# Patient Record
Sex: Male | Born: 1955 | Race: White | Hispanic: No | Marital: Married | Smoking: Never smoker
Health system: Southern US, Community
[De-identification: ages and names within clinical notes are randomized; demographics above are authoritative.]

## PROBLEM LIST (undated history)

## (undated) DIAGNOSIS — M25552 Pain in left hip: Secondary | ICD-10-CM

## (undated) DIAGNOSIS — M1712 Unilateral primary osteoarthritis, left knee: Secondary | ICD-10-CM

## (undated) HISTORY — PX: ELBOW SURGERY: SHX618

## (undated) HISTORY — PX: KNEE SURGERY: SHX244

## (undated) HISTORY — PX: HAND SURGERY: SHX662

---
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
## 1997-07-16 ENCOUNTER — Ambulatory Visit (HOSPITAL_COMMUNITY): Admission: RE | Admit: 1997-07-16 | Discharge: 1997-07-16 | Payer: Self-pay | Admitting: Specialist

## 2003-03-07 ENCOUNTER — Ambulatory Visit (HOSPITAL_BASED_OUTPATIENT_CLINIC_OR_DEPARTMENT_OTHER): Admission: RE | Admit: 2003-03-07 | Discharge: 2003-03-07 | Payer: Self-pay | Admitting: Internal Medicine

## 2009-08-22 ENCOUNTER — Ambulatory Visit (HOSPITAL_COMMUNITY): Admission: RE | Admit: 2009-08-22 | Discharge: 2009-08-22 | Payer: Self-pay | Admitting: Orthopedic Surgery

## 2009-10-01 ENCOUNTER — Ambulatory Visit (HOSPITAL_COMMUNITY): Admission: RE | Admit: 2009-10-01 | Discharge: 2009-10-01 | Payer: Self-pay | Admitting: Orthopedic Surgery

## 2010-04-09 LAB — CBC
Hemoglobin: 13.5 g/dL (ref 13.0–17.0)
Platelets: 177 10*3/uL (ref 150–400)
WBC: 4.4 10*3/uL (ref 4.0–10.5)

## 2010-04-09 LAB — COMPREHENSIVE METABOLIC PANEL
Albumin: 4.2 g/dL (ref 3.5–5.2)
Calcium: 9 mg/dL (ref 8.4–10.5)
Chloride: 109 mEq/L (ref 96–112)
Creatinine, Ser: 0.93 mg/dL (ref 0.4–1.5)
Glucose, Bld: 113 mg/dL — ABNORMAL HIGH (ref 70–99)
Potassium: 4.2 mEq/L (ref 3.5–5.1)
Sodium: 140 mEq/L (ref 135–145)
Total Bilirubin: 0.8 mg/dL (ref 0.3–1.2)
Total Protein: 7.3 g/dL (ref 6.0–8.3)

## 2010-04-09 LAB — URINALYSIS, ROUTINE W REFLEX MICROSCOPIC: Hgb urine dipstick: NEGATIVE

## 2010-04-09 LAB — DIFFERENTIAL
Eosinophils Absolute: 0.2 10*3/uL (ref 0.0–0.7)
Monocytes Absolute: 0.3 10*3/uL (ref 0.1–1.0)
Monocytes Relative: 7 % (ref 3–12)
Neutro Abs: 2.4 10*3/uL (ref 1.7–7.7)

## 2012-05-10 IMAGING — CR DG ORBITS FOR FOREIGN BODY
2 series · 2 of 2 positions shown · non-contrast
Comparison: None.

CLINICAL DATA: Pre MRI

ORBITS FOR FOREIGN BODY - 2 VIEW

[view not recorded (1 of 2)]
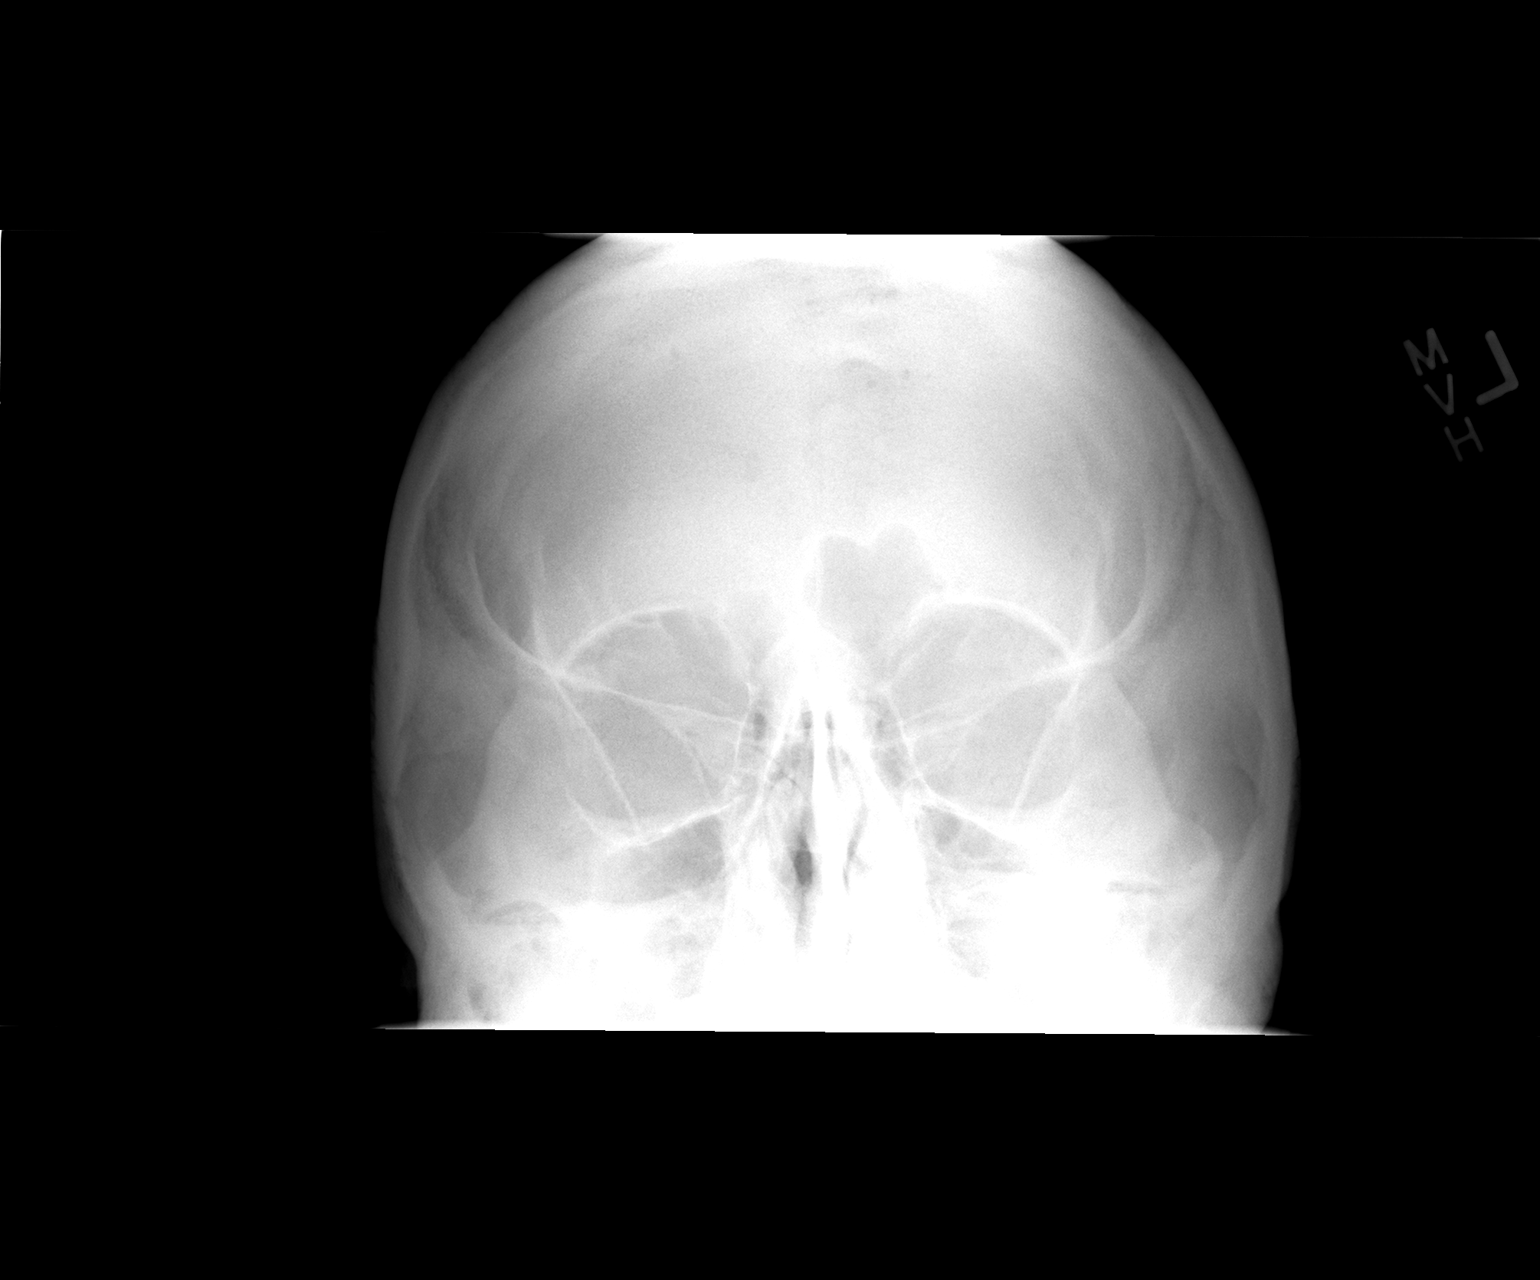

[view not recorded (2 of 2)]
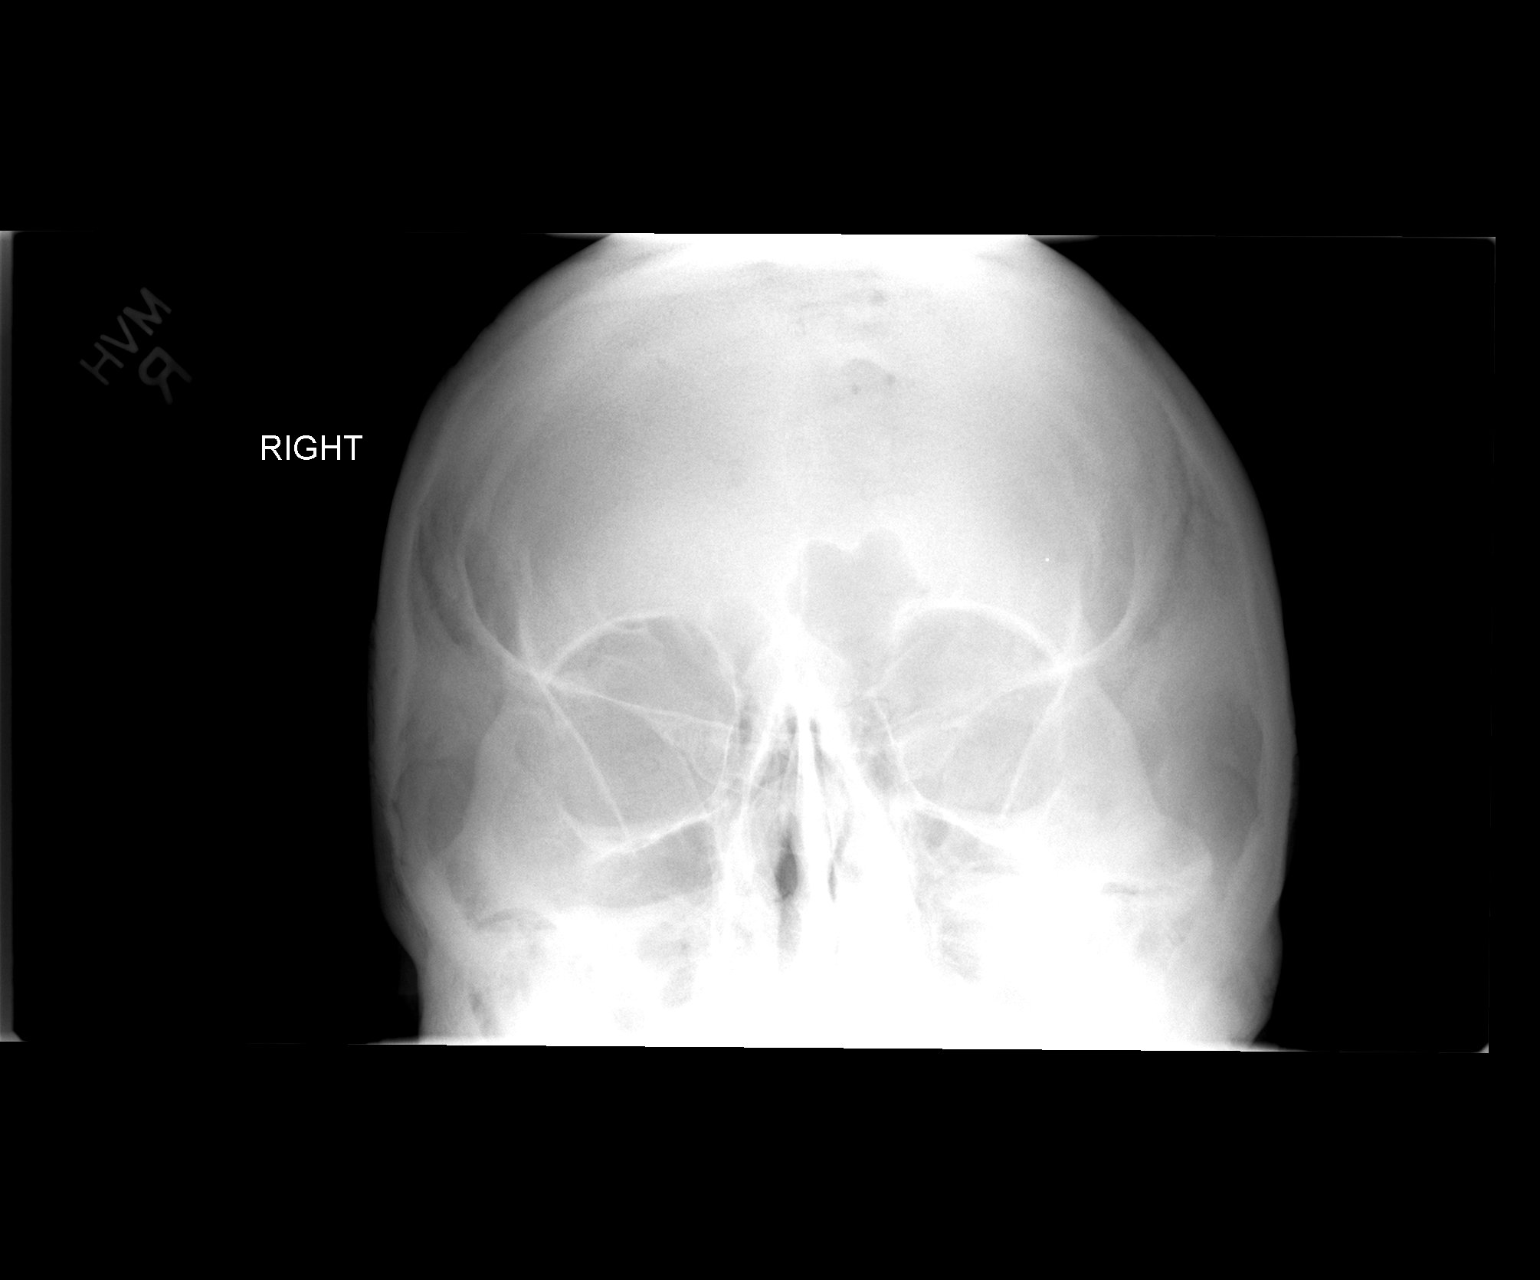

[2 of 2 positions shown; findings below may reference images not displayed]

FINDINGS: No radiodense foreign body within the orbits.
IMPRESSION: Patient cleared for MRI.

## 2015-01-01 ENCOUNTER — Emergency Department (HOSPITAL_COMMUNITY): Payer: BLUE CROSS/BLUE SHIELD

## 2015-01-01 ENCOUNTER — Encounter (HOSPITAL_COMMUNITY): Payer: Self-pay

## 2015-01-01 ENCOUNTER — Emergency Department (HOSPITAL_COMMUNITY)
Admission: EM | Admit: 2015-01-01 | Discharge: 2015-01-01 | Disposition: A | Discharge status: Home / Self Care | Payer: BLUE CROSS/BLUE SHIELD | Attending: Emergency Medicine | Admitting: Emergency Medicine

## 2015-01-01 DIAGNOSIS — K1120 Sialoadenitis, unspecified: Secondary | ICD-10-CM | POA: Insufficient documentation

## 2015-01-01 DIAGNOSIS — R220 Localized swelling, mass and lump, head: Secondary | ICD-10-CM | POA: Diagnosis present

## 2015-01-01 LAB — CBC WITH DIFFERENTIAL/PLATELET
Basophils Absolute: 0 10*3/uL (ref 0.0–0.1)
Basophils Relative: 0 %
Eosinophils Absolute: 0 10*3/uL (ref 0.0–0.7)
Eosinophils Relative: 0 %
HEMATOCRIT: 41.8 % (ref 39.0–52.0)
Hemoglobin: 14.2 g/dL (ref 13.0–17.0)
LYMPHS ABS: 0.7 10*3/uL (ref 0.7–4.0)
LYMPHS PCT: 7 %
MCH: 31 pg (ref 26.0–34.0)
MCHC: 34 g/dL (ref 30.0–36.0)
MCV: 91.3 fL (ref 78.0–100.0)
MONO ABS: 0.7 10*3/uL (ref 0.1–1.0)
MONOS PCT: 7 %
NEUTROS ABS: 7.7 10*3/uL (ref 1.7–7.7)
Neutrophils Relative %: 86 %
Platelets: 167 10*3/uL (ref 150–400)
RBC: 4.58 MIL/uL (ref 4.22–5.81)
RDW: 13.1 % (ref 11.5–15.5)
WBC: 9 10*3/uL (ref 4.0–10.5)

## 2015-01-01 LAB — BASIC METABOLIC PANEL
Anion gap: 9 (ref 5–15)
BUN: 13 mg/dL (ref 6–20)
CHLORIDE: 103 mmol/L (ref 101–111)
CO2: 24 mmol/L (ref 22–32)
CREATININE: 0.94 mg/dL (ref 0.61–1.24)
Calcium: 9 mg/dL (ref 8.9–10.3)
GFR calc Af Amer: >60 mL/min (ref >60)
GFR calc non Af Amer: >60 mL/min (ref >60)
Glucose, Bld: 121 mg/dL — ABNORMAL HIGH (ref 65–99)
POTASSIUM: 3.8 mmol/L (ref 3.5–5.1)
Sodium: 136 mmol/L (ref 135–145)

## 2015-01-01 LAB — RAPID STREP SCREEN (MED CTR MEBANE ONLY): Streptococcus, Group A Screen (Direct): NEGATIVE

## 2015-01-01 MED ORDER — IOHEXOL 300 MG/ML  SOLN
75.0000 mL | Freq: Once | INTRAMUSCULAR | Status: AC | PRN
  Administered 2015-01-01: 75 mL via INTRAVENOUS

## 2015-01-01 MED ORDER — HYDROCODONE-ACETAMINOPHEN 5-325 MG PO TABS: 1.0000 | ORAL_TABLET | ORAL | Status: DC | PRN

## 2015-01-01 MED ORDER — CLINDAMYCIN PHOSPHATE 600 MG/50ML IV SOLN
600.0000 mg | Freq: Once | INTRAVENOUS | Status: AC
  Administered 2015-01-01: 600 mg via INTRAVENOUS
  Filled 2015-01-01: qty 50

## 2015-01-01 MED ORDER — CLINDAMYCIN HCL 150 MG PO CAPS: 300.0000 mg | ORAL_CAPSULE | Freq: Three times a day (TID) | ORAL | Status: DC

## 2015-01-01 MED ORDER — DEXAMETHASONE SODIUM PHOSPHATE 10 MG/ML IJ SOLN
10.0000 mg | Freq: Once | INTRAMUSCULAR | Status: AC
  Administered 2015-01-01: 10 mg via INTRAVENOUS
  Filled 2015-01-01: qty 1

## 2015-01-01 MED ORDER — ACETAMINOPHEN 650 MG RE SUPP
650.0000 mg | Freq: Once | RECTAL | Status: AC
  Administered 2015-01-01: 650 mg via RECTAL
  Filled 2015-01-01: qty 1

## 2015-01-01 NOTE — ED Notes (Signed)
Patient was sent to the ED per Dr. Ricci BarkerMarrow. Patient c/o facial swelling ,and tongue swelling. Patient does have difficulty swallowing. Patient was sent over to rule out abscess.

## 2015-01-01 NOTE — Progress Notes (Signed)
Pt confirmed with Cm his pcp is Farris Hasaron morrow EPIC updated

## 2015-01-01 NOTE — Discharge Instructions (Signed)
Take the prescribed medication as directed. Recommend soft diet-- mashed potatoes, pudding, Jell-O, ice cream, milk shakes. If you feel you're not getting adequate nutrients, may add in ensure shake. Follow-up with ENT-- call to make appt. Return to the ED for new or worsening symptoms.

## 2015-01-01 NOTE — ED Provider Notes (Signed)
CSN: [id number]     Arrival date & time 01/01/15  1307 History   First MD Initiated Contact with Patient 01/01/15 1402     Chief Complaint  Patient presents with  . Abscess  . Sore Throat  . Facial Swelling     (Consider location/radiation/quality/duration/timing/severity/associated sxs/prior Treatment) Patient is a 59 y.o. male presenting with abscess and pharyngitis. The history is provided by the patient and medical records.  Abscess Sore Throat Associated symptoms include a sore throat.    59 year old male here with sore throat and difficulty swallowing. Patient states symptoms began last Friday and have been progressively worsening throughout the past few days. He reports subjective fever but denies chills or sweats. He has not checked his temperature. He states he was seen by Dr. Kateri PlummerMorrow in the clinic earlier today and had a rapid strep performed which was negative. He states he was sent here for further evaluation.  He states he has had some difficulty swallowing this morning, tried to eat some broth but this was largely unsuccessful.  Patient denies any changes in medications. He is not on any Ace inhibitors.  He has no known medication or food allergies.  He denies any dental pain.  No chest pain or SOB.  VSS.  History reviewed. No pertinent past medical history. Past Surgical History  Procedure Laterality Date  . Knee surgery     Family History  Problem Relation Age of Onset  . Cancer Mother   . Cancer Brother    Social History  Substance Use Topics  . Smoking status: Never Smoker   . Smokeless tobacco: Never Used  . Alcohol Use: No    Review of Systems  HENT: Positive for sore throat.   All other systems reviewed and are negative.     Allergies  Review of patient's allergies indicates not on file.  Home Medications   Prior to Admission medications   Not on File   BP 140/92 mmHg  Pulse 95  Temp(Src) 98.9 F (37.2 C) (Oral)  Resp 14  SpO2 95%    Physical Exam  Constitutional: He is oriented to person, place, and time. He appears well-developed and well-nourished. No distress.  HENT:  Head: Normocephalic and atraumatic.  Mouth/Throat: Oropharynx is clear and moist.  Tongue does not appear edematous; tonsils 1+ and erythematous without exudates noted; uvula midline; handling secretions well Some dental caries noted, no signs of dental abscess  Eyes: Conjunctivae and EOM are normal. Pupils are equal, round, and reactive to light.  Neck: Trachea normal, normal range of motion, full passive range of motion without pain and phonation normal. Neck supple. No tracheal deviation present.  left-sided cervical lymphadenopathy and swelling noted that is tender to palpation; trachea midline; pain noted when turning head left; normal phonation, no stridor  Cardiovascular: Normal rate, regular rhythm and normal heart sounds.   Pulmonary/Chest: Effort normal and breath sounds normal. No stridor. No respiratory distress. He has no wheezes.  Abdominal: Soft. Bowel sounds are normal.  Musculoskeletal: Normal range of motion.  Neurological: He is alert and oriented to person, place, and time.  Skin: Skin is warm and dry. He is not diaphoretic.  Psychiatric: He has a normal mood and affect.  Nursing note and vitals reviewed.   ED Course  Procedures (including critical care time) Labs Review Labs Reviewed  BASIC METABOLIC PANEL - Abnormal; Notable for the following:    Glucose, Bld 121 (*)    All other components within normal limits  RAPID  STREP SCREEN (NOT AT [facility])  CULTURE, GROUP A STREP  CBC WITH DIFFERENTIAL/PLATELET    Imaging Review Ct Soft Tissue Neck W Contrast  01/01/2015  CLINICAL DATA:  59 year old male with face and tongue swelling, difficulty swallowing. Symptoms greater on the left. Initial encounter. EXAM: CT NECK WITH CONTRAST TECHNIQUE: Multidetector CT imaging of the neck was performed using the standard protocol following  the bolus administration of intravenous contrast. CONTRAST:  75mL OMNIPAQUE IOHEXOL 300 MG/ML  SOLN COMPARISON:  None. FINDINGS: Pharynx and larynx: Mild asymmetric swelling of the left lateral pharyngeal wall and left hypopharynx. The larynx remains normal. The epiglottis is normal. There is left parapharyngeal space edema/ fluid maximal near the left submandibular gland (see below). Negative right parapharyngeal space. Negative retropharyngeal space. Salivary glands: Asymmetric hyper enhancement, enlargement, and indistinct appearance of the left submandibular gland with surrounding stranding and fluid. No asymmetric enlargement of the left submandibular duct is identified. However, there is a 13 mm long by 4 mm diameter sialolith in the anterior left sublingual space (series 2, image 48). The central and right sublingual space remain normal. The right submandibular gland is normal. Both parotid glands are within normal limits. Thyroid: Negative. Lymph nodes: There is mild asymmetric enlargement of the left level 2 and left level 3 nodes which probably is reactive. Other cervical nodal stations are symmetric and within normal limits. Vascular: Major vascular structures in the neck and at the skullbase are patent. Aberrant origin of the right subclavian artery. Limited intracranial: Negative. Visualized orbits: Negative. Mastoids and visualized paranasal sinuses: Negative. Skeleton: Widespread cervical spine degeneration. No acute osseous abnormality identified. Upper chest: Negative lung apices. No superior mediastinal lymphadenopathy. IMPRESSION: 1. Acute left submandibular obstructive sialoadenitis. 13 x 4 mm sialolith in the anterior left sublingual space. 2. Associated inflammation in the left sublingual space, left submandibular space, and left parapharyngeal space. Reactive lymphadenopathy. No abscess. Electronically Signed   By: Odessa Fleming M.D.   On: 01/01/2015 15:44   I have personally reviewed and  evaluated these images and lab results as part of my medical decision-making.   EKG Interpretation None      MDM   Final diagnoses:  Infectious sialoadenitis of major salivary gland   58 year old male here with left-sided neck swelling and difficulty swallowing. Patient with low-grade fever. He is handling his secretions well. Trachea is midline. Tonsils 1+ bilaterally without exudates, uvula remains midline. He does have noted swelling to the left side of neck as well as significant cervical lymphadenopathy. Labwork is overall reassuring. CT soft tissue neck was obtained revealing infectious sialoadenitis.  Patient has been able to tolerate oral fluids here in the emergency department. He continues handling his secretions well. He states he feels that he will be able to tolerate oral medications at home. Patient was given dose of IV clindamycin and Decadron here in the ED, will discharge home with clindamycin and pain medication. Encouraged soft diet at home.  He is to follow-up with ENT.  Discussed plan with patient and wife, they acknowledged understanding and agreed with plan of care.  Return precautions given for new or worsening symptoms.  Case discussed with attending physician, Dr. Fayrene Fearing, who agrees with assessment and plan of care.  Garlon Hatchet, PA-C 01/01/15 1608  Rolland Porter, MD 01/09/15 [phone]

## 2015-01-03 LAB — CULTURE, GROUP A STREP: STREP A CULTURE: NEGATIVE

## 2018-03-07 DIAGNOSIS — M1712 Unilateral primary osteoarthritis, left knee: Secondary | ICD-10-CM

## 2018-03-07 HISTORY — DX: Unilateral primary osteoarthritis, left knee: M17.12

## 2018-03-29 DIAGNOSIS — M25552 Pain in left hip: Secondary | ICD-10-CM

## 2018-03-29 HISTORY — DX: Pain in left hip: M25.552

## 2018-10-06 ENCOUNTER — Encounter: Payer: Self-pay | Admitting: Cardiology

## 2018-10-06 ENCOUNTER — Ambulatory Visit (INDEPENDENT_AMBULATORY_CARE_PROVIDER_SITE_OTHER): Payer: BC Managed Care – PPO | Admitting: Cardiology

## 2018-10-06 ENCOUNTER — Encounter: Payer: Self-pay | Admitting: *Deleted

## 2018-10-06 ENCOUNTER — Other Ambulatory Visit: Payer: Self-pay

## 2018-10-06 VITALS — BP 128/92 | HR 66 | Temp 99.2°F | Ht 68.0 in | Wt 231.0 lb

## 2018-10-06 DIAGNOSIS — Z8249 Family history of ischemic heart disease and other diseases of the circulatory system: Secondary | ICD-10-CM

## 2018-10-06 DIAGNOSIS — R9431 Abnormal electrocardiogram [ECG] [EKG]: Secondary | ICD-10-CM | POA: Diagnosis not present

## 2018-10-06 DIAGNOSIS — E782 Mixed hyperlipidemia: Secondary | ICD-10-CM

## 2018-10-06 NOTE — Patient Instructions (Signed)
Medication Instructions:  Your physician recommends that you continue on your current medications as directed. Please refer to the Current Medication list given to you today.  If you need a refill on your cardiac medications before your next appointment, please call your pharmacy.   Lab work: Your physician recommends that you return for lab work in: [place]  If you have labs (blood work) drawn today and your tests are completely normal, you will receive your results only by: Marland Kitchen MyChart Message (if you have MyChart) OR . A paper copy in the mail If you have any lab test that is abnormal or we need to change your treatment, we will call you to review the results.  Testing/Procedures: Your physician has requested that you have an echocardiogram. Echocardiography is a painless test that uses sound waves to create images of your heart. It provides your doctor with information about the size and shape of your heart and how well your heart's chambers and valves are working. This procedure takes approximately one hour. There are no restrictions for this procedure.  Your physician has requested that you have a lexiscan myoview. For further information please visit [email or web address]. Please follow instruction sheet, as given.     Follow-Up: At [facility], you and your health needs are our priority.  As part of our continuing mission to provide you with exceptional heart care, we have created designated Provider Care Teams.  These Care Teams include your primary Cardiologist (physician) and Advanced Practice Providers (APPs -  Physician Assistants and Nurse Practitioners) who all work together to provide you with the care you need, when you need it. You will need a follow up appointment in 1 months with Dr Harriet Masson. Any Other Special Instructions Will Be Listed Below (If Applicable).

## 2018-10-06 NOTE — Progress Notes (Signed)
Cardiology Office Note:    Date:  10/06/2018   ID:  Alvin Hernandez, DOB 03-12-1955, MRN [id number]  PCP:  London Pepper, MD  Cardiologist:  Berniece Salines, DO  Electrophysiologist:  None   Referring MD: London Pepper, MD   Chief Complaint  Patient presents with  . Pre-op Exam    Left hip replacement    History of Present Illness:    Alvin Hernandez is a 63 y.o. male with a hx of hypothyroidism, Hyperlipidemia ( intolerant to statins per patient he develop significant joint pain and muscle pain) and family history of significant coronary artery disease presents for preoperative clearance. He denies chest pain, shortness of breath, lightheadedness or dizziness.    Past Medical History:  Diagnosis Date  . Osteoarthritis of left knee 03/07/2018  . Pain of left hip joint 03/29/2018    Past Surgical History:  Procedure Laterality Date  . ELBOW SURGERY Right    Arthritis cleaned up  . HAND SURGERY Right   . KNEE SURGERY      Current Medications: Current Meds  Medication Sig  . ibuprofen (ADVIL,MOTRIN) 200 MG tablet Take 400 mg by mouth every 6 (six) hours as needed for headache, mild pain or moderate pain.  Marland Kitchen levothyroxine (SYNTHROID, LEVOTHROID) 125 MCG tablet Take 125 mcg by mouth daily.     Allergies:   Other   Social History   Socioeconomic History  . Marital status: Married    Spouse name: Not on file  . Number of children: Not on file  . Years of education: Not on file  . Highest education level: Not on file  Occupational History  . Not on file  Social Needs  . Financial resource strain: Not on file  . Food insecurity    Worry: Not on file    Inability: Not on file  . Transportation needs    Medical: Not on file    Non-medical: Not on file  Tobacco Use  . Smoking status: Never Smoker  . Smokeless tobacco: Never Used  Substance and Sexual Activity  . Alcohol use: No  . Drug use: No  . Sexual activity: Not on file  Lifestyle  . Physical activity   Days per week: Not on file    Minutes per session: Not on file  . Stress: Not on file  Relationships  . Social Herbalist on phone: Not on file    Gets together: Not on file    Attends religious service: Not on file    Active member of club or organization: Not on file    Attends meetings of clubs or organizations: Not on file    Relationship status: Not on file  Other Topics Concern  . Not on file  Social History Narrative  . Not on file     Family History: The patient's family history includes Cancer in his brother and mother. He report an MI in his mother at age 66.   ROS:     Review of Systems  Constitution: Negative for decreased appetite, fever and weight loss.  HENT: Negative for congestion, hearing loss and sore throat.   Eyes: Negative for blurred vision, pain and redness.  Cardiovascular: Negative for chest pain, dyspnea on exertion, near-syncope and palpitations.  Respiratory: Negative for cough, shortness of breath and wheezing.   Endocrine: Negative for cold intolerance and polyuria.  Hematologic/Lymphatic: Negative for bleeding problem. Does not bruise/bleed easily.  Skin: Negative for color change, itching and skin  cancer.  Musculoskeletal: Positive for joint pain (hip pain). Negative for back pain, muscle cramps and neck pain.  Gastrointestinal: Negative for bloating, constipation, heartburn, hematemesis, jaundice and nausea.  Genitourinary: Negative for flank pain, frequency, hematuria and nocturia.  Neurological: Negative for excessive daytime sleepiness, seizures and weakness.  Psychiatric/Behavioral: Negative for thoughts of violence. The patient does not have insomnia.   Allergic/Immunologic: Negative for HIV exposure.    EKGs/Labs/Other Studies Reviewed:    The following studies were reviewed today:   EKG:   The ekg ordered today demonstrates sinus bradycardia,HR 59bpm inferior interventricular conduction defect which could suggest old  inferior wall infarction. NO previous ecg for comparison.  Recent Labs: Serum creatinine 1.0, potassium 4 ALT 20, TSH Recent Lipid Panel Lipid panel performed in April 2019, total cholesterol 229, HDL 43, LDL 168, Triglycerides 88  Physical Exam:    VS:  BP (!) 128/92 (BP Location: Left Arm, Patient Position: Sitting, Cuff Size: Normal)   Pulse 66   Temp 99.2 F (37.3 C)   Ht 5\' 8"  (1.727 m)   Wt 231 lb (104.8 kg)   SpO2 93%   BMI 35.12 kg/m     Wt Readings from Last 3 Encounters:  10/06/18 231 lb (104.8 kg)     GEN:  Well nourished, well developed in no acute distress HEENT: Normal NECK: No JVD; No carotid bruits LYMPHATICS: No lymphadenopathy CARDIAC: RRR, no murmurs, rubs, gallops RESPIRATORY:  Clear to auscultation without rales, wheezing or rhonchi  ABDOMEN: Soft, non-tender, non-distended EXTREMITIES: No edema, no clubbing MUSCULOSKELETAL:  No edema; No deformity  SKIN: Warm and dry NEUROLOGIC:  Alert and oriented x 3, nonfocal PSYCHIATRIC:  Normal affect, good insight.  ASSESSMENT:    1. Family history of premature CAD   2. Abnormal electrocardiogram   3. Mixed hyperlipidemia    PLAN:     1. Alvin Hernandez is intermediate risk for CAD and with an abnormal ecg it is appropriate to perform a stress test. He will therefore undergo a pharmacologic nuclear stress test. In addition a TTE will be performed to assess for any structural abnormalities. 2. Lipid panel will be repeated as his labs are from 2019. 3. Based on above diagnostic testing,  I will make further recommendations.  4. FU in 1 month.  Disposition:   Medication Adjustments/Labs and Tests Ordered: Current medicines are reviewed at length with the patient today.  Concerns regarding medicines are outlined above.  Orders Placed This Encounter  Procedures  . Lipid Profile  . MYOCARDIAL PERFUSION IMAGING  . EKG 12-Lead  . ECHOCARDIOGRAM COMPLETE   No orders of the defined types were placed in  this encounter.   Patient Instructions  Medication Instructions:  Your physician recommends that you continue on your current medications as directed. Please refer to the Current Medication list given to you today.  If you need a refill on your cardiac medications before your next appointment, please call your pharmacy.   Lab work: Your physician recommends that you return for lab work in: TODAY Lipid  If you have labs (blood work) drawn today and your tests are completely normal, you will receive your results only by: Marland Kitchen MyChart Message (if you have MyChart) OR . A paper copy in the mail If you have any lab test that is abnormal or we need to change your treatment, we will call you to review the results.  Testing/Procedures: Your physician has requested that you have an echocardiogram. Echocardiography is a painless test that uses  sound waves to create images of your heart. It provides your doctor with information about the size and shape of your heart and how well your heart's chambers and valves are working. This procedure takes approximately one hour. There are no restrictions for this procedure.  Your physician has requested that you have a lexiscan myoview. For further information please visit [email or web address]. Please follow instruction sheet, as given.     Follow-Up: At [facility], you and your health needs are our priority.  As part of our continuing mission to provide you with exceptional heart care, we have created designated Provider Care Teams.  These Care Teams include your primary Cardiologist (physician) and Advanced Practice Providers (APPs -  Physician Assistants and Nurse Practitioners) who all work together to provide you with the care you need, when you need it. You will need a follow up appointment in 1 months with Dr Servando Salinaobb. Any Other Special Instructions Will Be Listed Below (If Applicable).       SignedThomasene Ripple, Adil Tugwell, DO  10/06/2018 8:20 PM    [facility]

## 2018-10-09 ENCOUNTER — Telehealth (HOSPITAL_COMMUNITY): Payer: Self-pay | Admitting: *Deleted

## 2018-10-09 NOTE — Telephone Encounter (Signed)
Patient given detailed instructions per Myocardial Perfusion Study Information Sheet for the test on 10/11/18 at 7:45. Patient notified to arrive 15 minutes early and that it is imperative to arrive on time for appointment to keep from having the test rescheduled.  If you need to cancel or reschedule your appointment, please call the office within 24 hours of your appointment. . Patient verbalized understanding.Alvin Hernandez

## 2018-10-10 ENCOUNTER — Ambulatory Visit (HOSPITAL_BASED_OUTPATIENT_CLINIC_OR_DEPARTMENT_OTHER)
Admission: RE | Admit: 2018-10-10 | Discharge: 2018-10-10 | Disposition: A | Discharge status: Home / Self Care | Payer: BC Managed Care – PPO | Source: Ambulatory Visit | Attending: Cardiology | Admitting: Cardiology

## 2018-10-10 ENCOUNTER — Other Ambulatory Visit: Payer: Self-pay

## 2018-10-10 DIAGNOSIS — R9431 Abnormal electrocardiogram [ECG] [EKG]: Secondary | ICD-10-CM | POA: Insufficient documentation

## 2018-10-10 NOTE — Progress Notes (Signed)
  Echocardiogram 2D Echocardiogram has been performed.  Alvin Hernandez 10/10/2018, 3:21 PM

## 2018-10-11 ENCOUNTER — Ambulatory Visit (HOSPITAL_COMMUNITY): Payer: BC Managed Care – PPO | Attending: Cardiovascular Disease

## 2018-10-11 ENCOUNTER — Telehealth: Payer: Self-pay | Admitting: *Deleted

## 2018-10-11 VITALS — Ht 68.0 in | Wt 231.0 lb

## 2018-10-11 DIAGNOSIS — R9431 Abnormal electrocardiogram [ECG] [EKG]: Secondary | ICD-10-CM | POA: Diagnosis present

## 2018-10-11 DIAGNOSIS — Z01818 Encounter for other preprocedural examination: Secondary | ICD-10-CM

## 2018-10-11 LAB — MYOCARDIAL PERFUSION IMAGING
LV dias vol: 99 mL (ref 62–150)
LV sys vol: 45 mL
Peak HR: 86 {beats}/min
Rest HR: 59 {beats}/min
SDS: 0
SRS: 0
SSS: 0
TID: 0.98

## 2018-10-11 LAB — LIPID PANEL
Chol/HDL Ratio: 4.7 ratio (ref 0.0–5.0)
Cholesterol, Total: 224 mg/dL — ABNORMAL HIGH (ref 100–199)
HDL: 48 mg/dL (ref >39)
LDL Chol Calc (NIH): 153 mg/dL — ABNORMAL HIGH (ref 0–99)
Triglycerides: 130 mg/dL (ref 0–149)
VLDL Cholesterol Cal: 23 mg/dL (ref 5–40)

## 2018-10-11 MED ORDER — TECHNETIUM TC 99M TETROFOSMIN IV KIT
10.9000 | PACK | Freq: Once | INTRAVENOUS | Status: AC | PRN
  Administered 2018-10-11: 10.9 via INTRAVENOUS
  Filled 2018-10-11: qty 11

## 2018-10-11 MED ORDER — ROSUVASTATIN CALCIUM 5 MG PO TABS: 5.0000 mg | ORAL_TABLET | Freq: Every day | ORAL | 1 refills | Status: DC

## 2018-10-11 MED ORDER — REGADENOSON 0.4 MG/5ML IV SOLN
0.4000 mg | Freq: Once | INTRAVENOUS | Status: AC
  Administered 2018-10-11: 0.4 mg via INTRAVENOUS

## 2018-10-11 MED ORDER — TECHNETIUM TC 99M TETROFOSMIN IV KIT
32.5000 | PACK | Freq: Once | INTRAVENOUS | Status: AC | PRN
  Administered 2018-10-11: 32.5 via INTRAVENOUS
  Filled 2018-10-11: qty 33

## 2018-10-11 NOTE — Telephone Encounter (Signed)
-----   Message from Berniece Salines, DO sent at 10/11/2018 10:43 AM EDT ----- Please tell patient about his lipid panel - abnormal. I will like to start him on crestor 5mg  nightly, if he is will to help improve his LDL.

## 2018-10-11 NOTE — Telephone Encounter (Signed)
Telephone call to patient .Informed of lab results and need to start Crestor 5 mg nightly. Pt agreeable to plan and prescription called in to [facility] on [address] per pt request.

## 2018-11-03 ENCOUNTER — Ambulatory Visit: Payer: BC Managed Care – PPO | Admitting: Cardiology

## 2019-04-29 ENCOUNTER — Other Ambulatory Visit: Payer: Self-pay | Admitting: Cardiology

## 2020-01-08 ENCOUNTER — Other Ambulatory Visit: Payer: Self-pay | Admitting: Nurse Practitioner

## 2020-01-08 DIAGNOSIS — U071 COVID-19: Secondary | ICD-10-CM

## 2020-01-08 NOTE — Progress Notes (Signed)
I connected by phone with patient to discuss the potential use of a new treatment for mild to moderate COVID-19 viral infection in non-hospitalized patients.   This patient is a that meets the FDA criteria for Emergency Use Authorization of COVID monoclonal antibody casirivimab/imdevimab. 1. Has a (+) direct SARS-CoV-2 viral test result 2. Has mild or moderate COVID-19  3. Is NOT hospitalized due to COVID-19 4. Is within 10 days of symptom onset 5. Has at least one of the high risk factor(s) for progression to severe COVID-19 and/or hospitalization as defined in EUA. ? Specific high risk criteria : obesity     I have spoken and communicated the following to the patient or parent/caregiver regarding COVID monoclonal antibody treatment:   1. FDA has authorized the emergency use for the treatment of high risk post-exposure prophylaxis for COVID19.    2. The significant known and potential risks and benefits of COVID monoclonal antibody, and the extent to which such potential risks and benefits are unknown.   3.  Patients treated with COVID monoclonal antibody should continue to self-isolate and use infection control measures (e.g., wear mask, isolate, social distance, avoid sharing personal items, clean and disinfect "high touch" surfaces, and frequent handwashing) according to CDC guidelines.    5. The patient or parent/caregiver has the option to accept or refuse COVID monoclonal antibody treatment.   After reviewing this information with the patient, The patient agreed to proceed with receiving monoclonal antibody infusion and will be provided a copy of the Fact sheet prior to receiving the infusion. Ocie Bob, NP

## 2020-01-10 ENCOUNTER — Ambulatory Visit (HOSPITAL_COMMUNITY)
Admission: RE | Admit: 2020-01-10 | Discharge: 2020-01-10 | Disposition: A | Discharge status: Home / Self Care | Payer: BC Managed Care – PPO | Source: Ambulatory Visit | Attending: Pulmonary Disease | Admitting: Pulmonary Disease

## 2020-01-10 DIAGNOSIS — U071 COVID-19: Secondary | ICD-10-CM | POA: Diagnosis present

## 2020-01-10 MED ORDER — SODIUM CHLORIDE 0.9 % IV SOLN: INTRAVENOUS | Status: DC | PRN

## 2020-01-10 MED ORDER — DIPHENHYDRAMINE HCL 50 MG/ML IJ SOLN: 50.0000 mg | Freq: Once | INTRAMUSCULAR | Status: DC | PRN

## 2020-01-10 MED ORDER — METHYLPREDNISOLONE SODIUM SUCC 125 MG IJ SOLR: 125.0000 mg | Freq: Once | INTRAMUSCULAR | Status: DC | PRN

## 2020-01-10 MED ORDER — EPINEPHRINE 0.3 MG/0.3ML IJ SOAJ: 0.3000 mg | Freq: Once | INTRAMUSCULAR | Status: DC | PRN

## 2020-01-10 MED ORDER — SODIUM CHLORIDE 0.9 % IV SOLN: Freq: Once | INTRAVENOUS | Status: AC

## 2020-01-10 MED ORDER — ALBUTEROL SULFATE HFA 108 (90 BASE) MCG/ACT IN AERS: 2.0000 | INHALATION_SPRAY | Freq: Once | RESPIRATORY_TRACT | Status: DC | PRN

## 2020-01-10 MED ORDER — FAMOTIDINE IN NACL 20-0.9 MG/50ML-% IV SOLN: 20.0000 mg | Freq: Once | INTRAVENOUS | Status: DC | PRN

## 2020-01-10 NOTE — Discharge Instructions (Signed)
10 Things You Can Do to Manage Your COVID-19 Symptoms at Home If you have possible or confirmed COVID-19: 1. Stay home from work and school. And stay away from other public places. If you must go out, avoid using any kind of public transportation, ridesharing, or taxis. 2. Monitor your symptoms carefully. If your symptoms get worse, call your healthcare provider immediately. 3. Get rest and stay hydrated. 4. If you have a medical appointment, call the healthcare provider ahead of time and tell them that you have or may have COVID-19. 5. For medical emergencies, call 911 and notify the dispatch personnel that you have or may have COVID-19. 6. Cover your cough and sneezes with a tissue or use the inside of your elbow. 7. Wash your hands often with soap and water for at least 20 seconds or clean your hands with an alcohol-based hand sanitizer that contains at least 60% alcohol. 8. As much as possible, stay in a specific room and away from other people in your home. Also, you should use a separate bathroom, if available. If you need to be around other people in or outside of the home, wear a mask. 9. Avoid sharing personal items with other people in your household, like dishes, towels, and bedding. 10. Clean all surfaces that are touched often, like counters, tabletops, and doorknobs. Use household cleaning sprays or wipes according to the label instructions. cdc.gov/coronavirus 07/26/2018 This information is not intended to replace advice given to you by your health care provider. Make sure you discuss any questions you have with your health care provider. Document Revised: 12/28/2018 Document Reviewed: 12/28/2018 Elsevier Patient Education  2020 [employer] What types of side effects do monoclonal antibody drugs cause?  Common side effects  In general, the more common side effects caused by monoclonal antibody drugs include: . Allergic reactions, such as hives or itching . Flu-like signs and  symptoms, including chills, fatigue, fever, and muscle aches and pains . Nausea, vomiting . Diarrhea . Skin rashes . Low blood pressure   The CDC is recommending patients who receive monoclonal antibody treatments wait at least 90 days before being vaccinated.  Currently, there are no data on the safety and efficacy of mRNA COVID-19 vaccines in persons who received monoclonal antibodies or convalescent plasma as part of COVID-19 treatment. Based on the estimated half-life of such therapies as well as evidence suggesting that reinfection is uncommon in the 90 days after initial infection, vaccination should be deferred for at least 90 days, as a precautionary measure until additional information becomes available, to avoid interference of the antibody treatment with vaccine-induced immune responses. If you have any questions or concerns after the infusion please call the Advanced Practice Provider on call at [phone]. This number is ONLY intended for your use regarding questions or concerns about the infusion post-treatment side-effects.  Please do not provide this number to others for use. For return to work notes please contact your primary care provider.   If someone you know is interested in receiving treatment please have them call the COVID hotline at [phone].   

## 2020-01-10 NOTE — Progress Notes (Addendum)
Patient reviewed Fact Sheet for Patients, Parents, and Caregivers for Emergency Use Authorization (EUA) of bamlanivimab and etesevimab for the Treatment of Coronavirus. Patient also reviewed and is agreeable to the estimated cost of treatment. Patient is agreeable to proceed.   

## 2020-01-10 NOTE — Progress Notes (Signed)
  Diagnosis: COVID-19  Physician:  Dr. Patrick Wright  Procedure:  Medication fact sheet provided to patient; all questions answered.  Allergies reviewed with patient.  IV placed.  Bamlanivimab and etesevimab administered via IV infusion.  Complications: No immediate complications noted.  Discharge: Discharged home   Alvin Hernandez 01/10/2020  

## 2021-03-16 DIAGNOSIS — E785 Hyperlipidemia, unspecified: Secondary | ICD-10-CM | POA: Diagnosis not present

## 2021-03-16 DIAGNOSIS — I10 Essential (primary) hypertension: Secondary | ICD-10-CM | POA: Diagnosis not present

## 2021-03-16 DIAGNOSIS — R609 Edema, unspecified: Secondary | ICD-10-CM | POA: Diagnosis not present

## 2021-03-16 DIAGNOSIS — E039 Hypothyroidism, unspecified: Secondary | ICD-10-CM | POA: Diagnosis not present

## 2021-05-15 DIAGNOSIS — I10 Essential (primary) hypertension: Secondary | ICD-10-CM | POA: Diagnosis not present

## 2021-05-15 DIAGNOSIS — E039 Hypothyroidism, unspecified: Secondary | ICD-10-CM | POA: Diagnosis not present

## 2021-05-15 DIAGNOSIS — E785 Hyperlipidemia, unspecified: Secondary | ICD-10-CM | POA: Diagnosis not present

## 2021-07-16 DIAGNOSIS — W5501XA Bitten by cat, initial encounter: Secondary | ICD-10-CM | POA: Diagnosis not present

## 2021-07-16 DIAGNOSIS — S61451A Open bite of right hand, initial encounter: Secondary | ICD-10-CM | POA: Diagnosis not present

## 2021-08-10 DIAGNOSIS — Z800 Family history of malignant neoplasm of digestive organs: Secondary | ICD-10-CM | POA: Diagnosis not present

## 2021-08-10 DIAGNOSIS — Z1211 Encounter for screening for malignant neoplasm of colon: Secondary | ICD-10-CM | POA: Diagnosis not present

## 2021-08-10 DIAGNOSIS — K648 Other hemorrhoids: Secondary | ICD-10-CM | POA: Diagnosis not present

## 2021-11-24 DIAGNOSIS — R0989 Other specified symptoms and signs involving the circulatory and respiratory systems: Secondary | ICD-10-CM | POA: Diagnosis not present

## 2021-11-24 DIAGNOSIS — J988 Other specified respiratory disorders: Secondary | ICD-10-CM | POA: Diagnosis not present

## 2022-02-19 DIAGNOSIS — E669 Obesity, unspecified: Secondary | ICD-10-CM | POA: Diagnosis not present

## 2022-02-19 DIAGNOSIS — Z0000 Encounter for general adult medical examination without abnormal findings: Secondary | ICD-10-CM | POA: Diagnosis not present

## 2022-02-19 DIAGNOSIS — E785 Hyperlipidemia, unspecified: Secondary | ICD-10-CM | POA: Diagnosis not present

## 2022-02-19 DIAGNOSIS — E039 Hypothyroidism, unspecified: Secondary | ICD-10-CM | POA: Diagnosis not present

## 2022-02-19 DIAGNOSIS — Z125 Encounter for screening for malignant neoplasm of prostate: Secondary | ICD-10-CM | POA: Diagnosis not present

## 2022-03-08 ENCOUNTER — Other Ambulatory Visit (HOSPITAL_COMMUNITY): Payer: Self-pay | Admitting: Family Medicine

## 2022-03-08 DIAGNOSIS — E785 Hyperlipidemia, unspecified: Secondary | ICD-10-CM

## 2022-03-30 ENCOUNTER — Ambulatory Visit (HOSPITAL_COMMUNITY)
Admission: RE | Admit: 2022-03-30 | Discharge: 2022-03-30 | Disposition: A | Discharge status: Home / Self Care | Payer: BC Managed Care – PPO | Source: Ambulatory Visit | Attending: Family Medicine | Admitting: Family Medicine

## 2022-03-30 DIAGNOSIS — E785 Hyperlipidemia, unspecified: Secondary | ICD-10-CM

## 2023-09-23 ENCOUNTER — Other Ambulatory Visit: Payer: Self-pay | Admitting: Family Medicine

## 2023-09-23 DIAGNOSIS — I77810 Thoracic aortic ectasia: Secondary | ICD-10-CM
